# Patient Record
Sex: Female | Born: 1935 | Race: White | Hispanic: No | Smoking: Never smoker
Health system: Southern US, Community
[De-identification: ages and names within clinical notes are randomized; demographics above are authoritative.]

## PROBLEM LIST (undated history)

## (undated) DIAGNOSIS — J8410 Pulmonary fibrosis, unspecified: Secondary | ICD-10-CM

## (undated) DIAGNOSIS — I2510 Atherosclerotic heart disease of native coronary artery without angina pectoris: Secondary | ICD-10-CM

## (undated) DIAGNOSIS — I10 Essential (primary) hypertension: Secondary | ICD-10-CM

---

## 2010-01-11 ENCOUNTER — Encounter: Admission: RE | Admit: 2010-01-11 | Discharge: 2010-04-11 | Payer: Self-pay | Admitting: Ophthalmology

## 2016-04-06 ENCOUNTER — Emergency Department (HOSPITAL_COMMUNITY)
Admission: EM | Admit: 2016-04-06 | Discharge: 2016-04-06 | Disposition: A | Discharge status: Home / Self Care | Payer: Medicare Other | Attending: Emergency Medicine | Admitting: Emergency Medicine

## 2016-04-06 ENCOUNTER — Emergency Department (HOSPITAL_COMMUNITY): Payer: Medicare Other

## 2016-04-06 ENCOUNTER — Encounter (HOSPITAL_COMMUNITY): Payer: Self-pay | Admitting: Emergency Medicine

## 2016-04-06 DIAGNOSIS — R791 Abnormal coagulation profile: Secondary | ICD-10-CM | POA: Diagnosis not present

## 2016-04-06 DIAGNOSIS — I2510 Atherosclerotic heart disease of native coronary artery without angina pectoris: Secondary | ICD-10-CM | POA: Diagnosis not present

## 2016-04-06 DIAGNOSIS — R202 Paresthesia of skin: Secondary | ICD-10-CM

## 2016-04-06 DIAGNOSIS — I10 Essential (primary) hypertension: Secondary | ICD-10-CM | POA: Diagnosis not present

## 2016-04-06 HISTORY — DX: Pulmonary fibrosis, unspecified: J84.10

## 2016-04-06 HISTORY — DX: Atherosclerotic heart disease of native coronary artery without angina pectoris: I25.10

## 2016-04-06 HISTORY — DX: Essential (primary) hypertension: I10

## 2016-04-06 LAB — DIFFERENTIAL
Basophils Absolute: 0 10*3/uL (ref 0.0–0.1)
Basophils Relative: 1 %
Eosinophils Absolute: 0.2 10*3/uL (ref 0.0–0.7)
Eosinophils Relative: 4 %
Lymphocytes Relative: 30 %
Lymphs Abs: 1.3 10*3/uL (ref 0.7–4.0)
Monocytes Absolute: 0.4 10*3/uL (ref 0.1–1.0)
Monocytes Relative: 8 %
Neutro Abs: 2.4 10*3/uL (ref 1.7–7.7)
Neutrophils Relative %: 57 %

## 2016-04-06 LAB — COMPREHENSIVE METABOLIC PANEL
ALT: 11 U/L — ABNORMAL LOW (ref 14–54)
AST: 20 U/L (ref 15–41)
Albumin: 4 g/dL (ref 3.5–5.0)
Alkaline Phosphatase: 50 U/L (ref 38–126)
Anion gap: 7 (ref 5–15)
BUN: 13 mg/dL (ref 6–20)
CO2: 28 mmol/L (ref 22–32)
Calcium: 9.5 mg/dL (ref 8.9–10.3)
Chloride: 102 mmol/L (ref 101–111)
Creatinine, Ser: 0.78 mg/dL (ref 0.44–1.00)
GFR calc Af Amer: >60 mL/min (ref >60)
GFR calc non Af Amer: >60 mL/min (ref >60)
Glucose, Bld: 87 mg/dL (ref 65–99)
Potassium: 3.6 mmol/L (ref 3.5–5.1)
Sodium: 137 mmol/L (ref 135–145)
Total Bilirubin: 0.7 mg/dL (ref 0.3–1.2)
Total Protein: 6.5 g/dL (ref 6.5–8.1)

## 2016-04-06 LAB — CBG MONITORING, ED: Glucose-Capillary: 76 mg/dL (ref 65–99)

## 2016-04-06 LAB — PROTIME-INR
INR: 2.56
Prothrombin Time: 28 seconds — ABNORMAL HIGH (ref 11.4–15.2)

## 2016-04-06 LAB — CBC
HCT: 34.9 % — ABNORMAL LOW (ref 36.0–46.0)
Hemoglobin: 11.1 g/dL — ABNORMAL LOW (ref 12.0–15.0)
MCH: 30.7 pg (ref 26.0–34.0)
MCHC: 31.8 g/dL (ref 30.0–36.0)
MCV: 96.7 fL (ref 78.0–100.0)
Platelets: 201 10*3/uL (ref 150–400)
RBC: 3.61 MIL/uL — ABNORMAL LOW (ref 3.87–5.11)
RDW: 13.5 % (ref 11.5–15.5)
WBC: 4.3 10*3/uL (ref 4.0–10.5)

## 2016-04-06 LAB — I-STAT TROPONIN, ED: Troponin i, poc: 0.01 ng/mL (ref 0.00–0.08)

## 2016-04-06 LAB — I-STAT CHEM 8, ED
BUN: 14 mg/dL (ref 6–20)
Calcium, Ion: 1.14 mmol/L — ABNORMAL LOW (ref 1.15–1.40)
Chloride: 101 mmol/L (ref 101–111)
Creatinine, Ser: 0.8 mg/dL (ref 0.44–1.00)
Glucose, Bld: 86 mg/dL (ref 65–99)
HCT: 35 % — ABNORMAL LOW (ref 36.0–46.0)
Hemoglobin: 11.9 g/dL — ABNORMAL LOW (ref 12.0–15.0)
Potassium: 3.6 mmol/L (ref 3.5–5.1)
Sodium: 139 mmol/L (ref 135–145)
TCO2: 26 mmol/L (ref 0–100)

## 2016-04-06 LAB — APTT: aPTT: 50 seconds — ABNORMAL HIGH (ref 24–36)

## 2016-04-06 NOTE — ED Notes (Signed)
[facility]G [facility] 

## 2016-04-06 NOTE — ED Provider Notes (Signed)
[facility]-EMERGENCY DEPT Provider Note   CSN: [id number] Arrival date & time: 04/06/16  4098  By signing my name below, I, Placido Sou, attest that this documentation has been prepared under the direction and in the presence of Raeford Razor, MD. Electronically Signed: Placido Sou, ED Scribe. 04/06/16. 9:45 AM.   History   Chief Complaint Chief Complaint  Patient presents with  . Numbness    HPI HPI Comments: Natasha Bishop is a 82 y.o. female with a h/o CAD, HTN and pulmonary fibrosis who presents to the Emergency Department complaining of burning pain in her left lower arm and left lower leg onset last night at 8:00 PM. She states her symptoms are intermittent in her left lower arm and constant in her left lower leg and include associated paresthesias and numbness in the affected regions. Pt also reports lightheadedness and intermittent HA which have been ongoing issues and denies they have acutely worsened since the onset of her current symptoms. Her relative states that he saw her yesterday at 5:00 PM and that she was behaving normally at that time. Pt is unsure of acute visual changes due to being legally blind. She denies acute difficulty speaking.   The history is provided by the patient and a relative. No language interpreter was used.    Past Medical History:  Diagnosis Date  . Coronary artery disease   . Hypertension   . Pulmonary fibrosis (HCC)     There are no active problems to display for this patient.   History reviewed. No pertinent surgical history.  OB History    No data available       Home Medications    Prior to Admission medications   Not on File    Family History History reviewed. No pertinent family history.  Social History Social History  Substance Use Topics  . Smoking status: Never Smoker  . Smokeless tobacco: Never Used  . Alcohol use No     Allergies   Aspirin and Penicillins   Review of Systems Review of Systems  Eyes:  Negative for visual disturbance.  Musculoskeletal: Positive for myalgias.  Neurological: Positive for light-headedness, numbness and headaches. Negative for speech difficulty.  All other systems reviewed and are negative.  Physical Exam Updated Vital Signs BP 158/60 (BP Location: Left Arm)   Pulse 72   Temp 97.5 F (36.4 C) (Oral)   Resp 14   SpO2 100%   Physical Exam  Constitutional: She is oriented to person, place, and time. She appears well-developed and well-nourished. No distress.  HENT:  Head: Normocephalic and atraumatic.  Eyes: EOM are normal.  Neck: Normal range of motion.  Cardiovascular: Normal rate, regular rhythm and normal heart sounds.  Exam reveals no gallop and no friction rub.   No murmur heard. Pulmonary/Chest: Effort normal and breath sounds normal. No respiratory distress. She has no wheezes. She has no rales.  Abdominal: Soft. She exhibits no distension. There is no tenderness.  Musculoskeletal: Normal range of motion.  Neurological: She is alert and oriented to person, place, and time. She has normal strength. No cranial nerve deficit or sensory deficit. Coordination normal.  Did not test EOM and finger to nose secondary to blindness. No focal deficits otherwise.   Skin: Skin is warm and dry.  Psychiatric: She has a normal mood and affect. Judgment normal.  Nursing note and vitals reviewed.  ED Treatments / Results  Labs (all labs ordered are listed, but only abnormal results are displayed) Labs Reviewed  PROTIME-INR -  Abnormal; Notable for the following:       Result Value   Prothrombin Time 28.0 (*)    All other components within normal limits  APTT - Abnormal; Notable for the following:    aPTT 50 (*)    All other components within normal limits  CBC - Abnormal; Notable for the following:    RBC 3.61 (*)    Hemoglobin 11.1 (*)    HCT 34.9 (*)    All other components within normal limits  COMPREHENSIVE METABOLIC PANEL - Abnormal; Notable for  the following:    ALT 11 (*)    All other components within normal limits  I-STAT CHEM 8, ED - Abnormal; Notable for the following:    Calcium, Ion 1.14 (*)    Hemoglobin 11.9 (*)    HCT 35.0 (*)    All other components within normal limits  DIFFERENTIAL  I-STAT TROPOININ, ED  CBG MONITORING, ED    EKG  EKG Interpretation  Date/Time:  Friday April 06 2016 09:45:26 EDT Ventricular Rate:  71 PR Interval:    QRS Duration: 95 QT Interval:  467 QTC Calculation: 508 R Axis:   13 Text Interpretation:  normal sinus Low voltage, precordial leads Borderline T abnormalities, anterior leads Prolonged QT interval Baseline wander in lead(s) V2 Confirmed by Juleen China  MD, Lourdes Kucharski [phone]) on 04/06/2016 12:23:34 PM       Radiology Ct Head Wo Contrast  Result Date: 04/06/2016 CLINICAL DATA:  Left-sided numbness since yesterday. EXAM: CT HEAD WITHOUT CONTRAST TECHNIQUE: Contiguous axial images were obtained from the base of the skull through the vertex without intravenous contrast. COMPARISON:  None. FINDINGS: Brain: There is no evidence for acute hemorrhage, hydrocephalus, mass lesion, or abnormal extra-axial fluid collection. No definite CT evidence for acute infarction. Diffuse loss of parenchymal volume is consistent with atrophy. Patchy low attenuation in the deep hemispheric and periventricular white matter is nonspecific, but likely reflects chronic microvascular ischemic demyelination. Vascular: Choose 1 Skull: No skull fracture. Sinuses/Orbits: The visualized paranasal sinuses and mastoid air cells are clear. Visualized portions of the globes and intraorbital fat are unremarkable. Other: N/A IMPRESSION: 1. No acute intracranial abnormality. 2. Atrophy with chronic small vessel white matter ischemic demyelination. Electronically Signed   By: Kennith Center M.D.   On: 04/06/2016 10:10   Mr Brain Wo Contrast  Result Date: 04/06/2016 CLINICAL DATA:  Left-sided numbness.  Symptoms began last night.  EXAM: MRI HEAD WITHOUT CONTRAST TECHNIQUE: Multiplanar, multiecho pulse sequences of the brain and surrounding structures were obtained without intravenous contrast. COMPARISON:  Head CT from earlier today FINDINGS: Brain: No acute infarction, hemorrhage, hydrocephalus, extra-axial collection or mass lesion. Chronic small vessel disease that is mild moderate for age, mainly periventricular and pontine. Normal brain volume for age. Vascular: Normal flow voids. Skull and upper cervical spine: Normal marrow signal. Sinuses/Orbits: Bilateral cataract resection.  No acute finding Other: None. IMPRESSION: 1. No acute finding, including infarct. 2. Mild to moderate chronic microvascular disease. Electronically Signed   By: Marnee Spring M.D.   On: 04/06/2016 11:37    Procedures Procedures  DIAGNOSTIC STUDIES: Oxygen Saturation is 100% on RA, normal by my interpretation.    COORDINATION OF CARE: 9:40 AM Discussed next steps with pt. Pt verbalized understanding and is agreeable with the plan.    Medications Ordered in ED Medications - No data to display   Initial Impression / Assessment and Plan / ED Course  I have reviewed the triage vital signs and the nursing  notes.  Pertinent labs & imaging results that were available during my care of the patient were reviewed by me and considered in my medical decision making (see chart for details).  Clinical Course   86yF with persistent L sided numbness since yesterday. No acute neuro complaints otherwise. Imaging including MRI negative. It has been determined that no acute conditions requiring further emergency intervention are present at this time. The patient has been advised of the diagnosis and plan. I reviewed any labs and imaging including any potential incidental findings. We have discussed signs and symptoms that warrant return to the ED and they are listed in the discharge instructions.     Final Clinical Impressions(s) / ED Diagnoses   Final  diagnoses:  Paresthesia   I personally preformed the services scribed in my presence. The recorded information has been reviewed is accurate. Raeford RazorStephen Arnisha Laffoon, MD.   New Prescriptions New Prescriptions   No medications on file     Raeford RazorStephen Tramane Gorum, MD 04/08/16 1911

## 2016-04-06 NOTE — ED Triage Notes (Signed)
Pt here with left sided tingling starting last night; no obvious weakness noted

## 2017-01-26 IMAGING — CT CT HEAD W/O CM
4 series · 17 of 47 positions shown, 19 images · non-contrast
Comparison: None.

CLINICAL DATA: Left-sided numbness since yesterday.

EXAM:
CT HEAD WITHOUT CONTRAST
TECHNIQUE: Contiguous axial images were obtained from the base of the skull
through the vertex without intravenous contrast.

[Series 2: head without · axial · non-contrast · 0.42mm/px · z∈[-102,+23]mm · 7 of 35 slices shown, 9 images]
[im 5/35  brain]
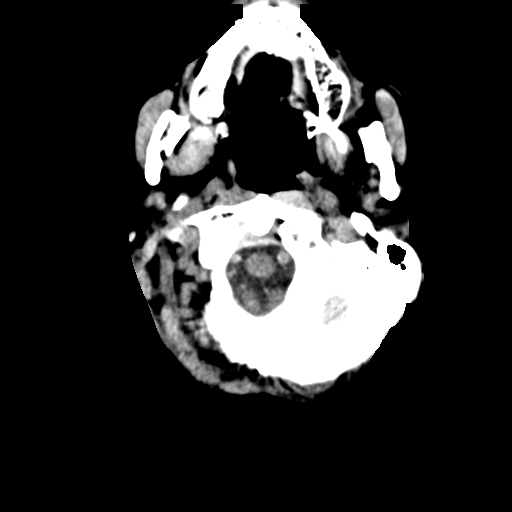
[im 5/35  bone]
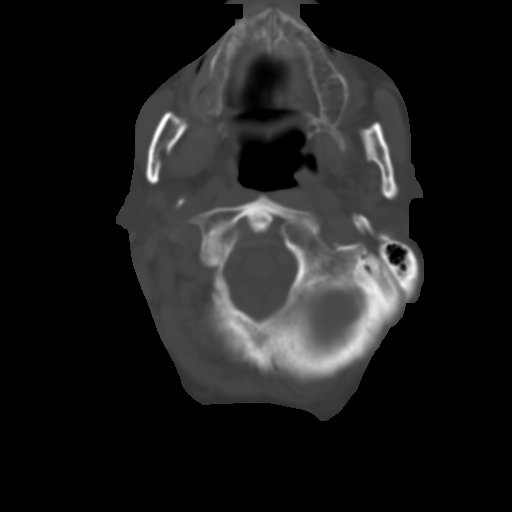
[im 9/35  brain]
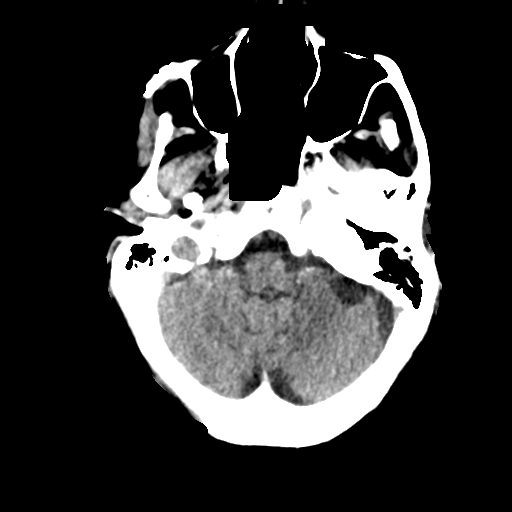
[im 13/35  brain]
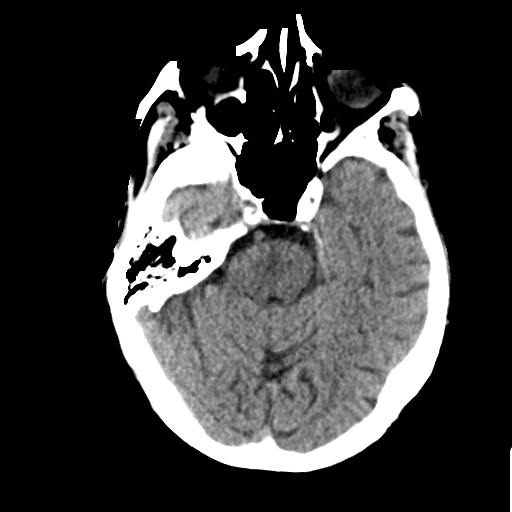
[im 18/35  brain]
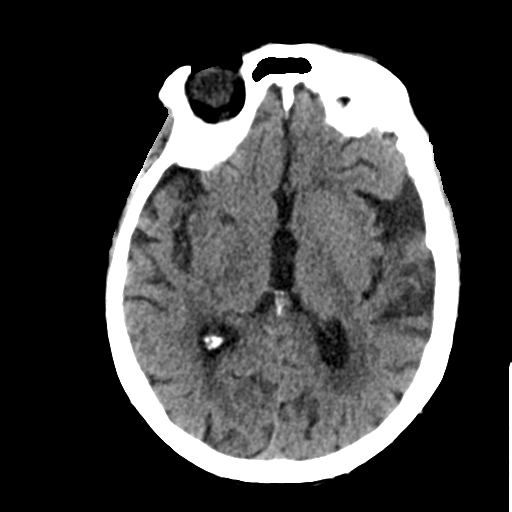
[im 22/35  brain]
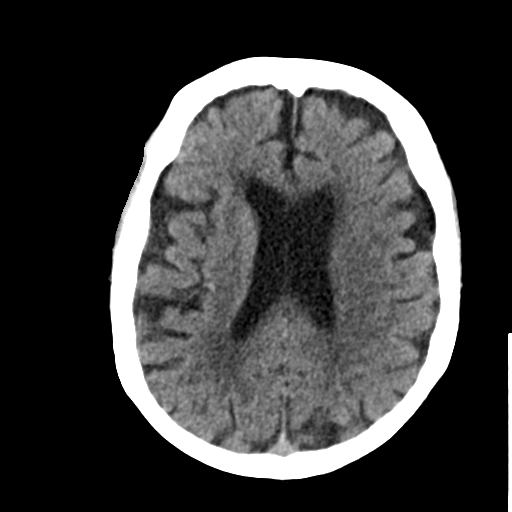
[im 22/35  bone]
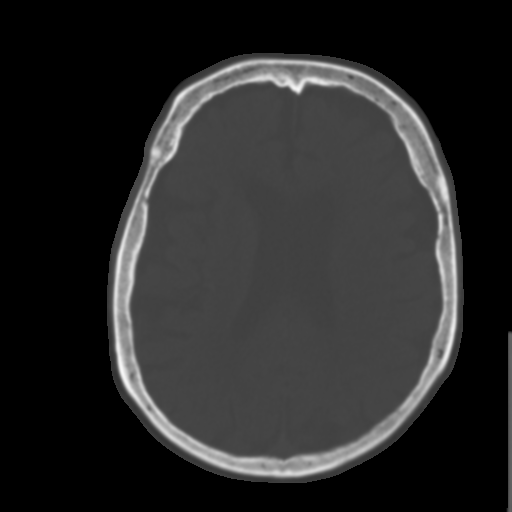
[im 26/35  brain]
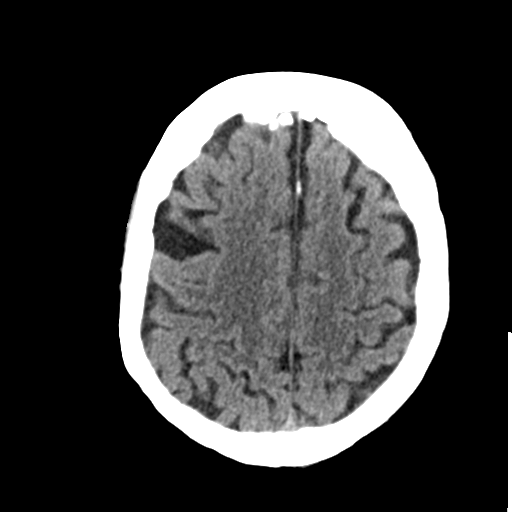
[im 30/35  brain]
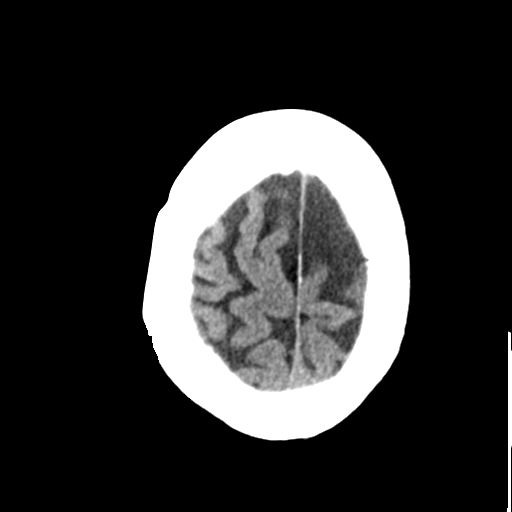

[Series 3: head bone · axial · 0.42mm/px · z∈[-106,-46]mm · 4 of 87 slices shown]
[im 9/87  bone]
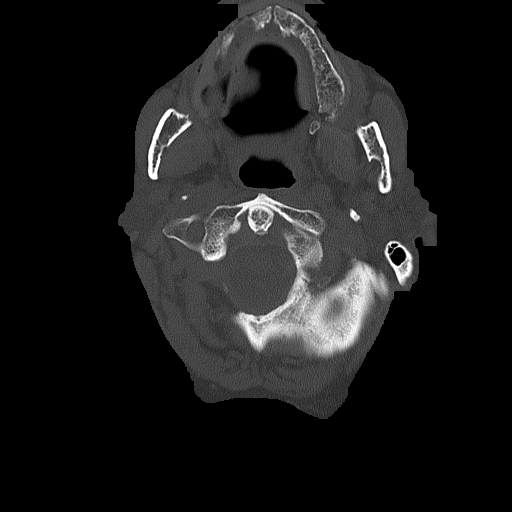
[im 18/87  bone]
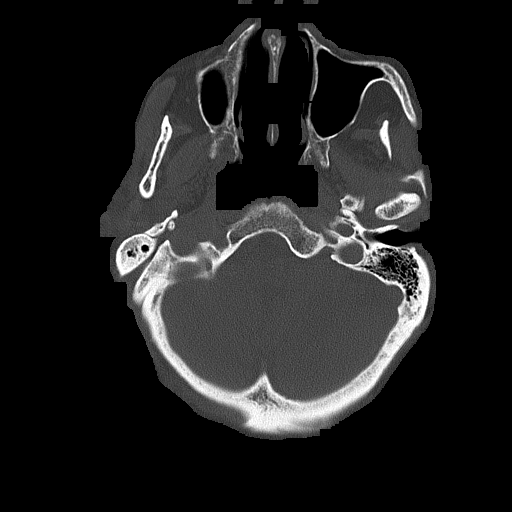
[im 26/87  bone]
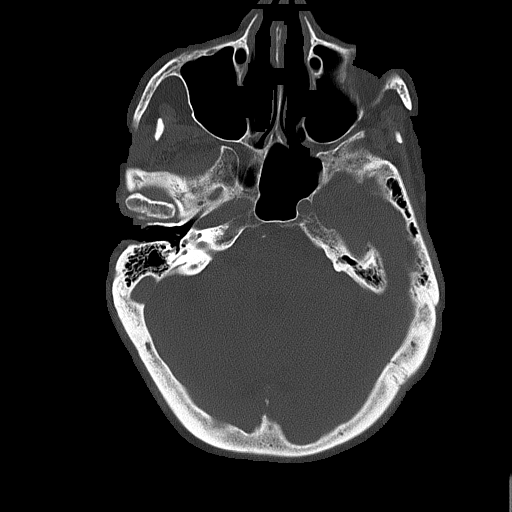
[im 39/87  bone]
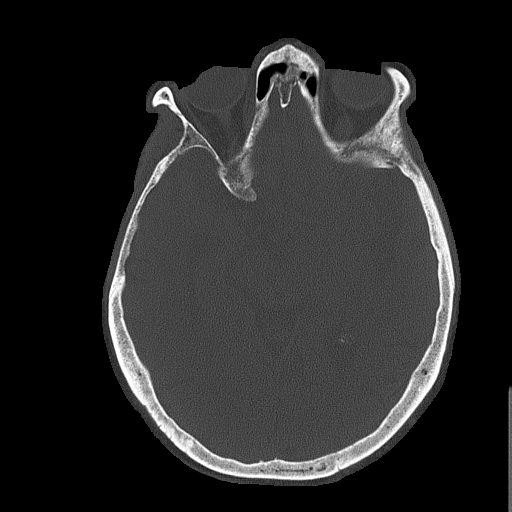

[Series 4: head without cor · coronal · non-contrast · 0.35mm/px · 3 of 67 slices shown]
[im 23/67  brain]
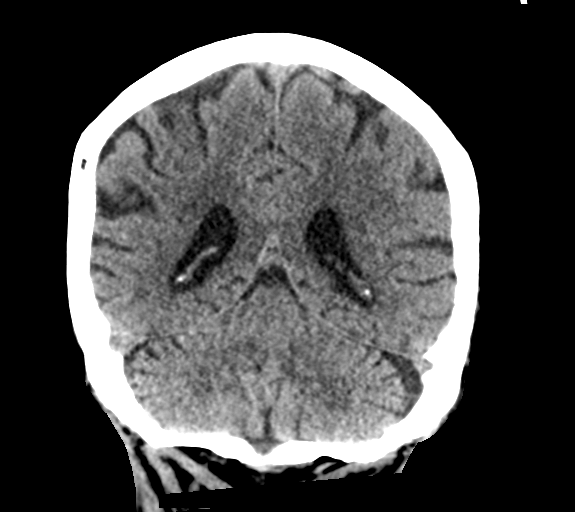
[im 30/67  brain]
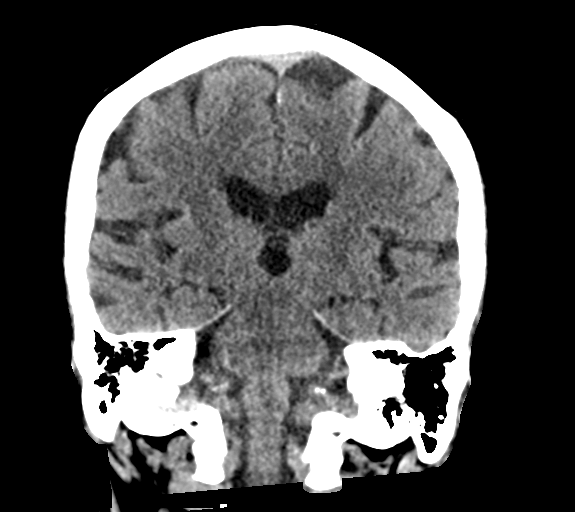
[im 37/67  brain]
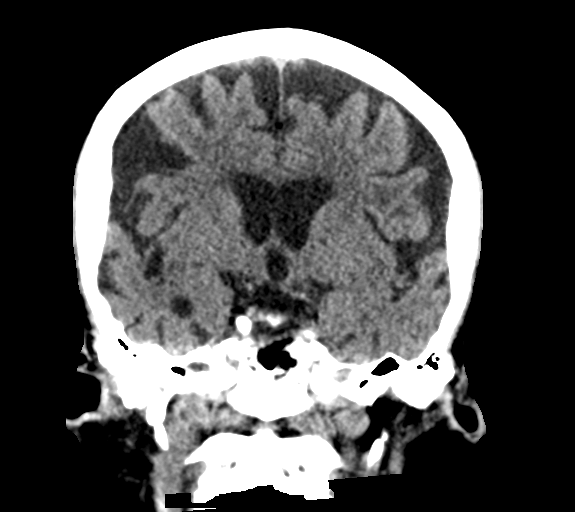

[Series 5: head without sag · sagittal · non-contrast · 0.33mm/px · 3 of 66 slices shown]
[im 22/66  brain]
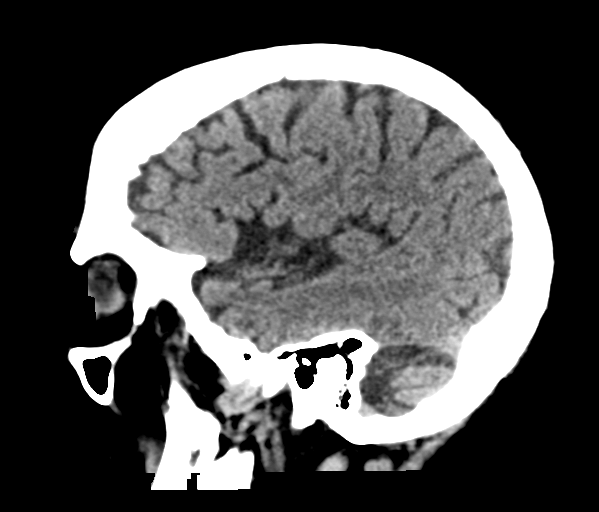
[im 33/66  brain]
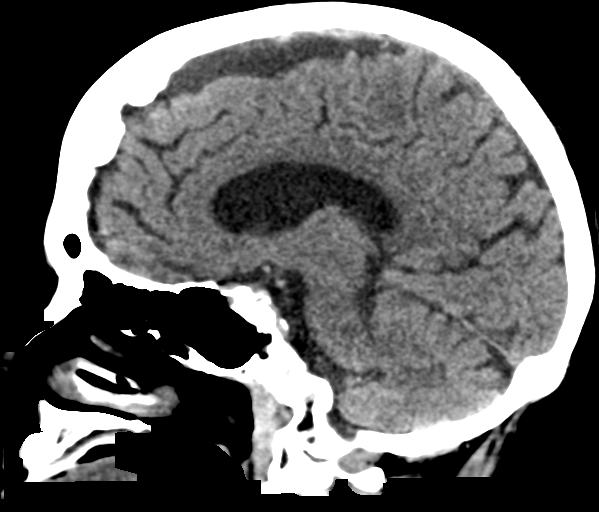
[im 44/66  brain]
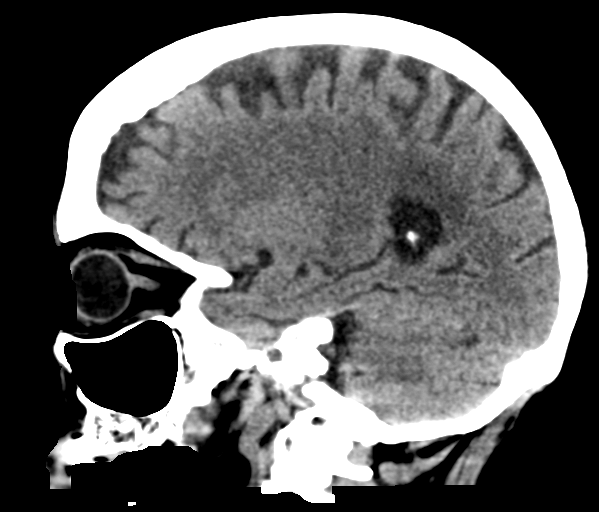

[17 of 47 positions shown; findings below may reference images not displayed]

FINDINGS: Brain: There is no evidence for acute hemorrhage, hydrocephalus,
mass lesion, or abnormal extra-axial fluid collection. No definite
CT evidence for acute infarction. Diffuse loss of parenchymal volume
is consistent with atrophy. Patchy low attenuation in the deep
hemispheric and periventricular white matter is nonspecific, but
likely reflects chronic microvascular ischemic demyelination.

Vascular: Choose 1

Skull: No skull fracture.

Sinuses/Orbits: The visualized paranasal sinuses and mastoid air
cells are clear. Visualized portions of the globes and intraorbital
fat are unremarkable.

Other: N/A
IMPRESSION: 1. No acute intracranial abnormality.
2. Atrophy with chronic small vessel white matter ischemic
demyelination.

## 2018-09-14 DEATH — deceased
